# Patient Record
Sex: Male | Born: 1965 | Race: White | Hispanic: No | Marital: Married | Smoking: Never smoker
Health system: Southern US, Community
[De-identification: ages and names within clinical notes are randomized; demographics above are authoritative.]

## PROBLEM LIST (undated history)

## (undated) DIAGNOSIS — R7303 Prediabetes: Secondary | ICD-10-CM

## (undated) DIAGNOSIS — E785 Hyperlipidemia, unspecified: Secondary | ICD-10-CM

---

## 2014-03-21 ENCOUNTER — Ambulatory Visit (INDEPENDENT_AMBULATORY_CARE_PROVIDER_SITE_OTHER): Payer: BC Managed Care – PPO | Admitting: Podiatry

## 2014-03-21 ENCOUNTER — Ambulatory Visit (INDEPENDENT_AMBULATORY_CARE_PROVIDER_SITE_OTHER): Payer: BC Managed Care – PPO

## 2014-03-21 DIAGNOSIS — M7660 Achilles tendinitis, unspecified leg: Secondary | ICD-10-CM

## 2014-03-21 DIAGNOSIS — R52 Pain, unspecified: Secondary | ICD-10-CM

## 2014-03-21 MED ORDER — DICLOFENAC SODIUM 75 MG PO TBEC: 75.0000 mg | DELAYED_RELEASE_TABLET | Freq: Two times a day (BID) | ORAL | Status: AC

## 2014-03-21 MED ORDER — TRIAMCINOLONE ACETONIDE 10 MG/ML IJ SUSP
10.0000 mg | Freq: Once | INTRAMUSCULAR | Status: AC
Start: 2014-03-21 — End: 2014-03-21
  Administered 2014-03-21: 10 mg

## 2014-03-21 NOTE — Patient Instructions (Signed)

## 2014-03-21 NOTE — Progress Notes (Signed)
   Subjective:    Patient ID: Austin Gay, male    DOB: 26-Nov-1965, 48 y.o.   MRN: [id number]  HPI PT STATED LT FOOT BACK OF THE HEEL IS SORE AND HAVE NUMBNESS FEELING ABOUT 1 1/2 MONTH. THE FOOT IS BEEN THE SAME.L THE FOOT GET AGGRAVATED FIRST THING IN THE MORNING. TRIED ICE BUT NO HELP.   Review of Systems  All other systems reviewed and are negative.      Objective:   Physical Exam        Assessment & Plan:

## 2014-03-21 NOTE — Progress Notes (Signed)
Subjective:     Patient ID: Austin Gay, male   DOB: [birth year], 48 y.o.   MRN: [id number]  HPI patient presents stating I'm getting a lot of pain in the back of my left heel and it has been difficult to do certain activities. It's been present for about 6 weeks and I've tried ice and I've tried shoe modifications   Review of Systems  All other systems reviewed and are negative.      Objective:   Physical Exam  Nursing note and vitals reviewed. Constitutional: He is oriented to person, place, and time.  Cardiovascular: Intact distal pulses.   Musculoskeletal: Normal range of motion.  Neurological: He is oriented to person, place, and time.  Skin: Skin is warm.   neurovascular status intact with muscle strength adequate and range of motion of the subtalar and midtarsal joint within normal limits. Patient has minimal equinus condition and is found to have inflammation in the posterior heel lateral side with a mild amount of fluid buildup and pain when pressed. Patient's digits are well-perfused and no depression of the arch is noted     Assessment:     Inflammatory Achilles tendinitis left lateral side with the medial and central tendon insertion not painful    Plan:      H&P and x-ray reviewed. Today after explaining risk of injection and rupture I did do a very careful lateral injection 3 mg dexamethasone Kenalog 5 mg Xylocaine and instructed on physical therapy and he'll lift usage. I did advise him on being careful with this for the next week. Reappoint if symptomatic

## 2015-10-05 ENCOUNTER — Ambulatory Visit (INDEPENDENT_AMBULATORY_CARE_PROVIDER_SITE_OTHER): Payer: 59

## 2015-10-05 ENCOUNTER — Ambulatory Visit (INDEPENDENT_AMBULATORY_CARE_PROVIDER_SITE_OTHER): Payer: 59 | Admitting: Podiatry

## 2015-10-05 DIAGNOSIS — M7662 Achilles tendinitis, left leg: Secondary | ICD-10-CM | POA: Diagnosis not present

## 2015-10-05 DIAGNOSIS — M779 Enthesopathy, unspecified: Secondary | ICD-10-CM

## 2015-10-05 NOTE — Progress Notes (Signed)
Subjective:     Patient ID: Austin Gay, male   DOB: 09/18/1965, 50 y.o.   MRN: [id number]  HPI patient presents stating that I'm doing better with my left foot but occasionally I get these sharp pains that I wanted you did check   Review of Systems     Objective:   Physical Exam Neurovascular status intact with difficulty in replicating the pain that the patient experiences me puts his left leg over his right leg and ties issues. Moderate discomfort in the Achilles tendon left    Assessment:     Appears to be inflammatory versus anything else and at this point reviewed    Plan:     X-rays reviewed and advised on physical therapy supportive shoes and if symptoms were to worsen we'll need to consider immobilization. Do not recommend any other aggressive treatment plan  Trays indicated small posterior spur formation but no indications of problems with the ankle joint or signs of arthritis

## 2019-08-04 ENCOUNTER — Other Ambulatory Visit: Payer: Self-pay | Admitting: Orthopedic Surgery

## 2019-08-04 DIAGNOSIS — M25511 Pain in right shoulder: Secondary | ICD-10-CM

## 2019-08-26 ENCOUNTER — Ambulatory Visit
Admission: RE | Admit: 2019-08-26 | Discharge: 2019-08-26 | Disposition: A | Discharge status: Home / Self Care | Payer: BC Managed Care – PPO | Source: Ambulatory Visit | Attending: Orthopedic Surgery | Admitting: Orthopedic Surgery

## 2019-08-26 ENCOUNTER — Other Ambulatory Visit: Payer: Self-pay

## 2019-08-26 DIAGNOSIS — M25511 Pain in right shoulder: Secondary | ICD-10-CM

## 2019-08-26 MED ORDER — IOPAMIDOL (ISOVUE-M 200) INJECTION 41%: 12.0000 mL | Freq: Once | INTRAMUSCULAR | Status: DC

## 2019-08-26 MED ORDER — METHYLPREDNISOLONE ACETATE 40 MG/ML INJ SUSP (RADIOLOG: 60.0000 mg | Freq: Once | INTRAMUSCULAR | Status: DC

## 2020-12-07 IMAGING — MR MR SHOULDER*R* W/CM
5 series · 40 of 40 positions shown · IV contrast (agent unspecified)
Comparison: Image from contrast injection reviewed.

CLINICAL DATA: Increasing right shoulder pain over the past year.
No known injury. The patient is a tennis player.

EXAM:
MR ARTHROGRAM OF THE RIGHT SHOULDER
TECHNIQUE: Multiplanar, multisequence MR imaging of the right shoulder was
performed following the administration of intra-articular contrast.
CONTRAST:  See Injection Documentation.

[Series 3: T1 fat-sat · axial · 4.0mm · 0.29mm/px · z∈[-52,+46]mm · 10 of 21 slices shown (1 of 3)]
[im 1/21]
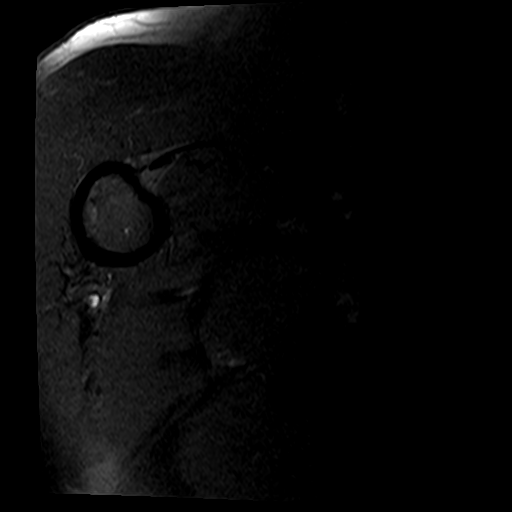
[im 3/21]
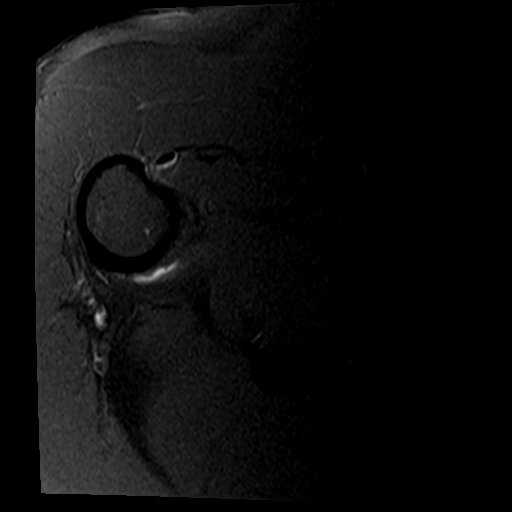
[im 5/21]
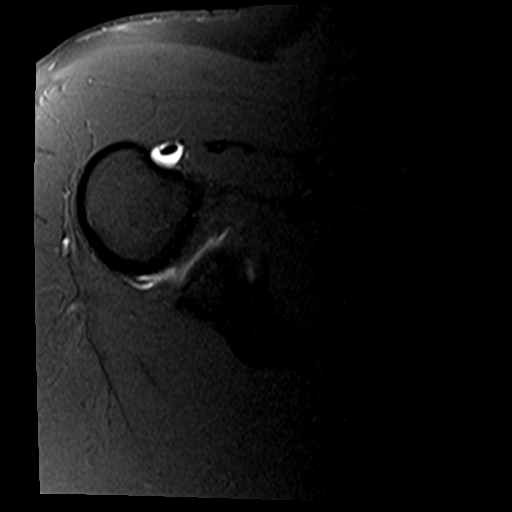
[im 7/21]
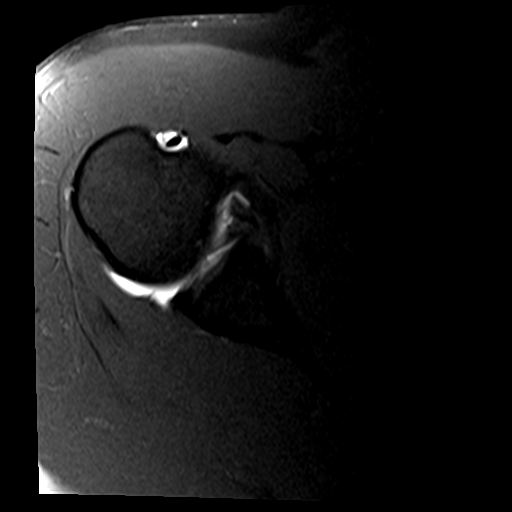
[im 9/21]
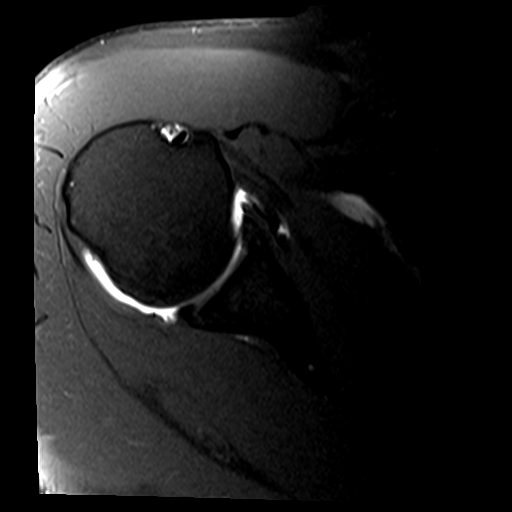
[im 12/21]
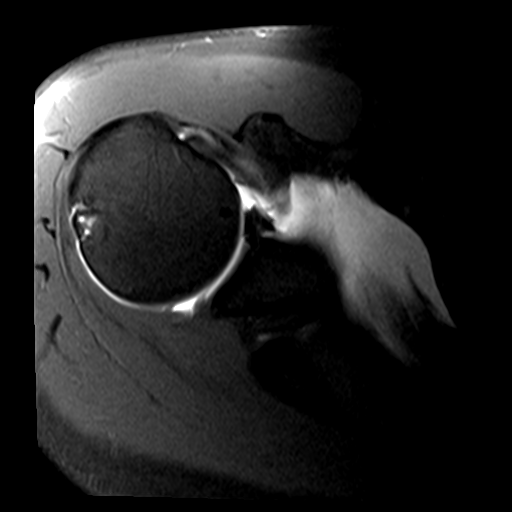
[im 14/21]
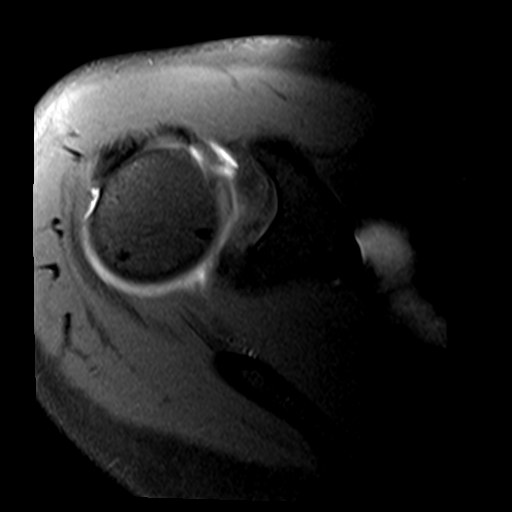
[im 16/21]
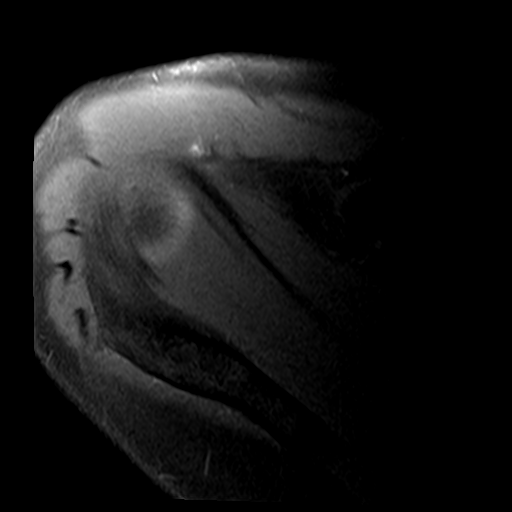
[im 18/21]
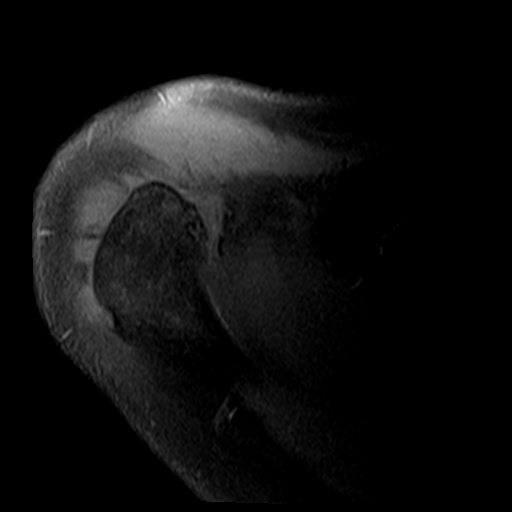
[im 21/21]
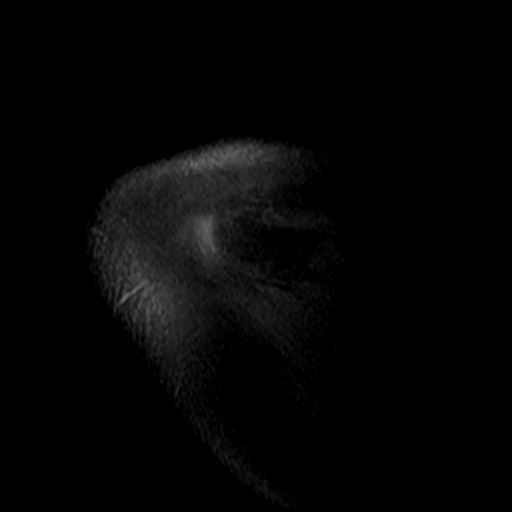

[Series 4: T2 fat-sat · oblique · 4.0mm · 0.59mm/px · 9 of 21 slices shown (1 of 2)]
[im 1/21]
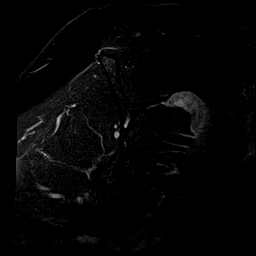
[im 3/21]
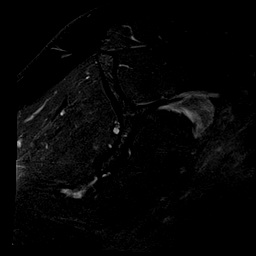
[im 6/21]
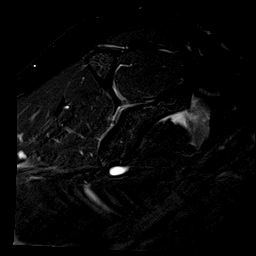
[im 8/21]
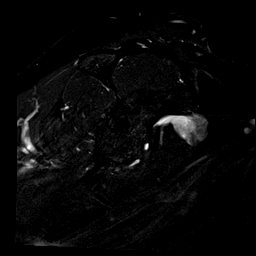
[im 11/21]
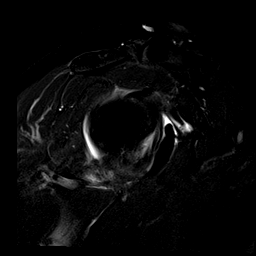
[im 13/21]
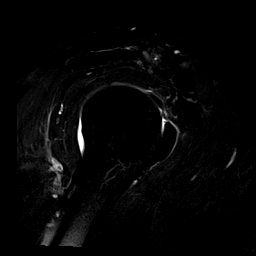
[im 16/21]
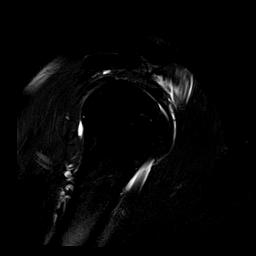
[im 18/21]
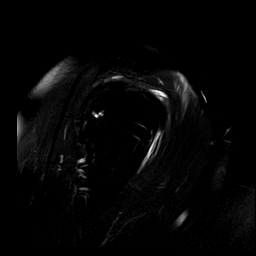
[im 21/21]
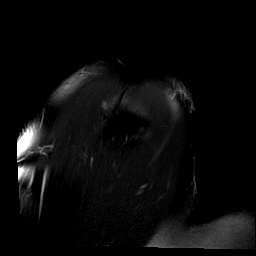

[Series 5: T1 fat-sat · oblique · 4.0mm · 0.59mm/px · 7 of 17 slices shown (2 of 3)]
[im 1/17]
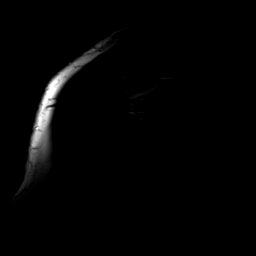
[im 3/17]
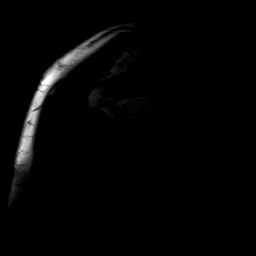
[im 6/17]
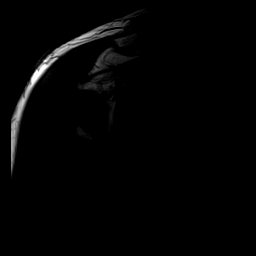
[im 9/17]
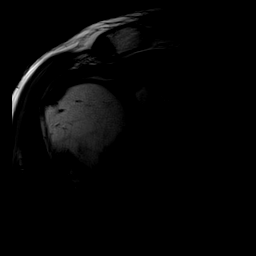
[im 11/17]
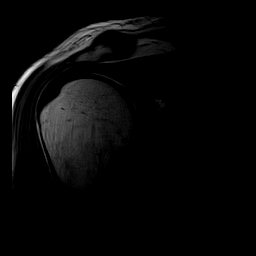
[im 14/17]
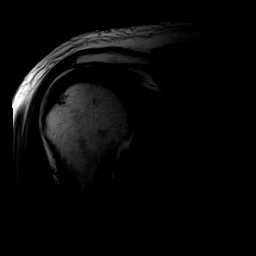
[im 17/17]
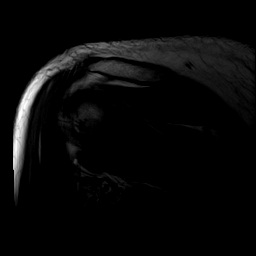

[Series 6: T1 fat-sat · oblique · 4.0mm · 0.55mm/px · 7 of 17 slices shown (3 of 3)]
[im 1/17]
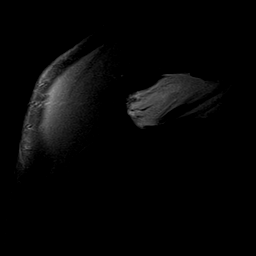
[im 3/17]
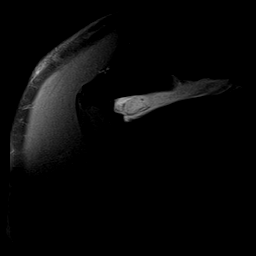
[im 6/17]
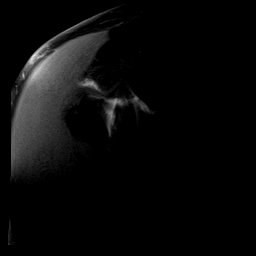
[im 9/17]
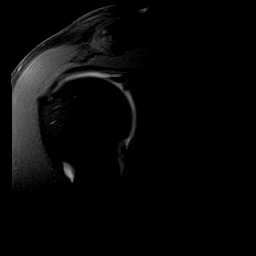
[im 11/17]
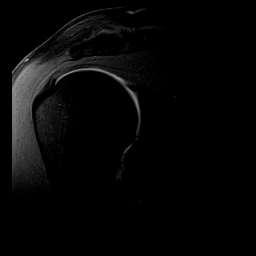
[im 14/17]
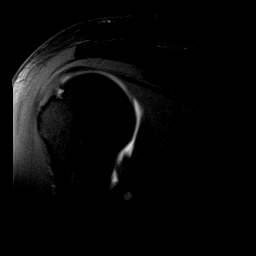
[im 17/17]
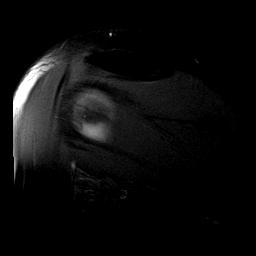

[Series 7: T2 fat-sat · oblique · 4.0mm · 0.55mm/px · 7 of 17 slices shown (2 of 2)]
[im 1/17]
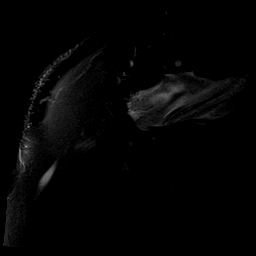
[im 3/17]
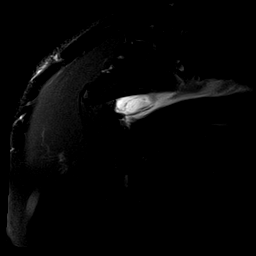
[im 6/17]
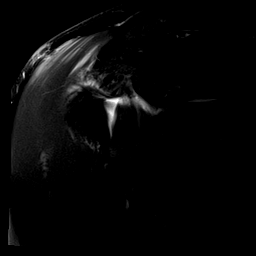
[im 9/17]
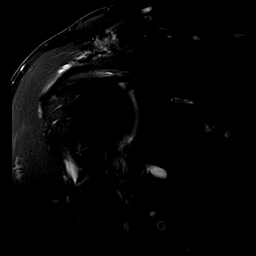
[im 11/17]
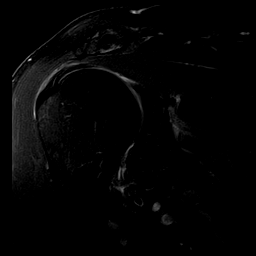
[im 14/17]
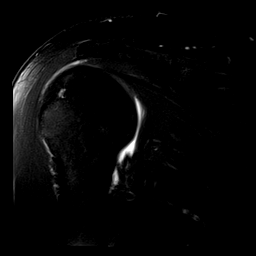
[im 17/17]
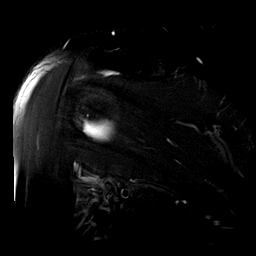

[40 of 40 positions shown; findings below may reference images not displayed]

FINDINGS: Rotator cuff: Intact. Mildly increased T2 signal in the
supraspinatus and infraspinatus tendons consistent with tendinopathy
noted.

Muscles: Normal without atrophy or focal lesion.

Biceps long head: Intact with normal signal. The biceps attachment
to the superior labrum is normal.

Acromioclavicular Joint: Moderate degenerative change is present.
The acromion is type 2. There is no contrast in the
subacromial/subdeltoid bursa but there is some fluid in the bursa.

Glenohumeral Joint: Appears normal.

Labrum: The anterior, inferior labrum appears degenerated and frayed
but no focal labral tear is seen.

Bones: No fracture or focal lesion. Marrow signal is unremarkable.
IMPRESSION: 1. Mild appearing supraspinatus and infraspinatus tendinopathy
without tear.
2. Moderate acromioclavicular osteoarthritis.
3. Degenerated and frayed anterior, inferior labrum without frank
tear identified.
4. Small volume of subacromial/subdeltoid fluid compatible with
bursitis.

## 2022-05-23 ENCOUNTER — Other Ambulatory Visit: Payer: Self-pay

## 2022-05-23 ENCOUNTER — Emergency Department (HOSPITAL_BASED_OUTPATIENT_CLINIC_OR_DEPARTMENT_OTHER): Payer: BC Managed Care – PPO | Admitting: Radiology

## 2022-05-23 ENCOUNTER — Encounter (HOSPITAL_BASED_OUTPATIENT_CLINIC_OR_DEPARTMENT_OTHER): Payer: Self-pay | Admitting: Emergency Medicine

## 2022-05-23 ENCOUNTER — Emergency Department (HOSPITAL_BASED_OUTPATIENT_CLINIC_OR_DEPARTMENT_OTHER): Payer: BC Managed Care – PPO

## 2022-05-23 ENCOUNTER — Emergency Department (HOSPITAL_BASED_OUTPATIENT_CLINIC_OR_DEPARTMENT_OTHER)
Admission: EM | Admit: 2022-05-23 | Discharge: 2022-05-23 | Disposition: A | Discharge status: Home / Self Care | Payer: BC Managed Care – PPO | Attending: Emergency Medicine | Admitting: Emergency Medicine

## 2022-05-23 DIAGNOSIS — R630 Anorexia: Secondary | ICD-10-CM | POA: Diagnosis not present

## 2022-05-23 DIAGNOSIS — R7989 Other specified abnormal findings of blood chemistry: Secondary | ICD-10-CM | POA: Diagnosis not present

## 2022-05-23 DIAGNOSIS — R1031 Right lower quadrant pain: Secondary | ICD-10-CM | POA: Insufficient documentation

## 2022-05-23 DIAGNOSIS — D709 Neutropenia, unspecified: Secondary | ICD-10-CM | POA: Insufficient documentation

## 2022-05-23 DIAGNOSIS — D696 Thrombocytopenia, unspecified: Secondary | ICD-10-CM | POA: Insufficient documentation

## 2022-05-23 HISTORY — DX: Hyperlipidemia, unspecified: E78.5

## 2022-05-23 HISTORY — DX: Prediabetes: R73.03

## 2022-05-23 LAB — URINALYSIS, ROUTINE W REFLEX MICROSCOPIC
Bilirubin Urine: NEGATIVE
Glucose, UA: 500 mg/dL — AB
Hgb urine dipstick: NEGATIVE
Ketones, ur: NEGATIVE mg/dL
Leukocytes,Ua: NEGATIVE
Nitrite: NEGATIVE
Protein, ur: NEGATIVE mg/dL
Specific Gravity, Urine: 1.013 (ref 1.005–1.030)
pH: 6.5 (ref 5.0–8.0)

## 2022-05-23 LAB — CBC WITH DIFFERENTIAL/PLATELET
Abs Immature Granulocytes: 0 10*3/uL (ref 0.00–0.07)
Basophils Absolute: 0.1 10*3/uL (ref 0.0–0.1)
Basophils Relative: 2 %
Eosinophils Absolute: 0 10*3/uL (ref 0.0–0.5)
Eosinophils Relative: 0 %
HCT: 41.3 % (ref 39.0–52.0)
Hemoglobin: 14.4 g/dL (ref 13.0–17.0)
Lymphocytes Relative: 58 %
Lymphs Abs: 2 10*3/uL (ref 0.7–4.0)
MCH: 30.6 pg (ref 26.0–34.0)
MCHC: 34.9 g/dL (ref 30.0–36.0)
MCV: 87.9 fL (ref 80.0–100.0)
Monocytes Absolute: 0.3 10*3/uL (ref 0.1–1.0)
Monocytes Relative: 10 %
Neutro Abs: 1 10*3/uL — ABNORMAL LOW (ref 1.7–7.7)
Neutrophils Relative %: 30 %
Platelets: 105 10*3/uL — ABNORMAL LOW (ref 150–400)
RBC: 4.7 MIL/uL (ref 4.22–5.81)
RDW: 12.3 % (ref 11.5–15.5)
Smear Review: NORMAL
WBC: 3.4 10*3/uL — ABNORMAL LOW (ref 4.0–10.5)
nRBC: 0 % (ref 0.0–0.2)

## 2022-05-23 LAB — COMPREHENSIVE METABOLIC PANEL
ALT: 149 U/L — ABNORMAL HIGH (ref 0–44)
AST: 93 U/L — ABNORMAL HIGH (ref 15–41)
Albumin: 4.3 g/dL (ref 3.5–5.0)
Alkaline Phosphatase: 84 U/L (ref 38–126)
Anion gap: 10 (ref 5–15)
BUN: 13 mg/dL (ref 6–20)
CO2: 25 mmol/L (ref 22–32)
Calcium: 8.9 mg/dL (ref 8.9–10.3)
Chloride: 98 mmol/L (ref 98–111)
Creatinine, Ser: 0.98 mg/dL (ref 0.61–1.24)
GFR, Estimated: >60 mL/min (ref >60)
Glucose, Bld: 250 mg/dL — ABNORMAL HIGH (ref 70–99)
Potassium: 4 mmol/L (ref 3.5–5.1)
Sodium: 133 mmol/L — ABNORMAL LOW (ref 135–145)
Total Bilirubin: 0.7 mg/dL (ref 0.3–1.2)
Total Protein: 6.9 g/dL (ref 6.5–8.1)

## 2022-05-23 LAB — PROTIME-INR
INR: 1 (ref 0.8–1.2)
Prothrombin Time: 13.5 seconds (ref 11.4–15.2)

## 2022-05-23 MED ORDER — DOXYCYCLINE HYCLATE 100 MG PO TABS
100.0000 mg | ORAL_TABLET | Freq: Once | ORAL | Status: AC
  Administered 2022-05-23: 100 mg via ORAL
  Filled 2022-05-23: qty 1

## 2022-05-23 MED ORDER — DOXYCYCLINE HYCLATE 100 MG PO CAPS: 100.0000 mg | ORAL_CAPSULE | Freq: Two times a day (BID) | ORAL | 0 refills | Status: AC

## 2022-05-23 NOTE — ED Triage Notes (Signed)
Pt arrives to ED with c/o abnormal labs. He reports going to his PCP yesterday for body aches. The body aches started on 10/21. Associated symptoms include chills, night sweats. His abnormal labs included low WBC, platelets and elevated liver enzymes that were drawn yesterday. Was tested negative for covid/flu yesterday as well.

## 2022-05-23 NOTE — Discharge Instructions (Addendum)
You were seen in the emergency department for your abnormal labs.  Your white blood cell count and your liver numbers are improving compared to when your labs were performed as an outpatient a few days ago and your platelet numbers are normal.  The rest of your liver numbers were normal and your liver ultrasound appeared normal.  You had no obvious signs of urinary tract infection or pneumonia or gallbladder infection.  It is possible that you could have a tickborne illness such as Ehrlichiosis which can cause some of the symptoms that you are having as well as the abnormal labs that you are having.  This is treated with an antibiotic and you should complete this as prescribed.  Should follow-up with your primary doctor in the next week to have your symptoms and labs rechecked.  You should return to the emergency department if you are having severe abdominal pain, fevers, repetitive vomiting and cannot keep anything down, increasing bleeding or if you have any other new or concerning symptoms.

## 2022-05-23 NOTE — ED Provider Notes (Signed)
[facility] EMERGENCY DEPT Provider Note   CSN: [id number] Arrival date & time: 05/23/22  1422     History  Chief Complaint  Patient presents with   Abnormal Lab    Austin Gay is a 56 y.o. male.  Patient is a 56 year old male with a past medical history of hyperlipidemia and prediabetes presenting to the emergency department with abnormal labs.  Patient states that on Saturday he started to develop body aches and they worsened on Sunday and he started to have night sweats and chills.  He states that he saw his primary doctor who performed labs and he received a call today that his labs were abnormal and recommended that he come to the emergency department.  He states he has not measured any fever.  He denies any cough, congestion or sore throat.  He denies any rash.  He states that he has had a mildly decreased appetite but denies any nausea, vomiting or diarrhea.  He denies any abdominal pain.  He states that he drinks 1-2 beers daily but has never had any alcohol withdrawal.  He denies any known sick contacts.  He does report hiking frequently in the woods.  He denies seeing any recent ticks on him.  The history is provided by the patient.  Abnormal Lab      Home Medications Prior to Admission medications   Medication Sig Start Date End Date Taking? Authorizing Provider  doxycycline (VIBRAMYCIN) 100 MG capsule Take 1 capsule (100 mg total) by mouth 2 (two) times daily for 5 days. 05/23/22 05/28/22 Yes Theresia Lo, Benetta Spar K, DO  diclofenac (VOLTAREN) 75 MG EC tablet Take 1 tablet (75 mg total) by mouth 2 (two) times daily. Patient not taking: Reported on 10/05/2015 03/21/14   Lenn Sink, DPM      Allergies    Patient has no known allergies.    Review of Systems   Review of Systems  Physical Exam Updated Vital Signs BP 136/84   Pulse 65   Temp 98.7 F (37.1 C) (Oral)   Resp 18   Ht 6\' 3"  (1.905 m)   Wt 117.9 kg   SpO2 98%   BMI 32.50 kg/m   Physical Exam Vitals and nursing note reviewed.  Constitutional:      General: He is not in acute distress.    Appearance: Normal appearance.  HENT:     Head: Normocephalic and atraumatic.     Nose: Nose normal.     Mouth/Throat:     Mouth: Mucous membranes are moist.     Pharynx: Oropharynx is clear.  Eyes:     Extraocular Movements: Extraocular movements intact.     Conjunctiva/sclera: Conjunctivae normal.     Pupils: Pupils are equal, round, and reactive to light.  Cardiovascular:     Rate and Rhythm: Normal rate and regular rhythm.     Pulses: Normal pulses.     Heart sounds: Normal heart sounds.  Pulmonary:     Effort: Pulmonary effort is normal.     Breath sounds: Normal breath sounds.  Abdominal:     General: Abdomen is flat.     Palpations: Abdomen is soft.     Tenderness: There is no abdominal tenderness.  Musculoskeletal:        General: Normal range of motion.     Cervical back: Normal range of motion and neck supple.  Skin:    General: Skin is warm and dry.     Findings: No rash.  Comments: No visible tics  Neurological:     General: No focal deficit present.     Mental Status: He is alert and oriented to person, place, and time.  Psychiatric:        Mood and Affect: Mood normal.        Behavior: Behavior normal.     ED Results / Procedures / Treatments   Labs (all labs ordered are listed, but only abnormal results are displayed) Labs Reviewed  CBC WITH DIFFERENTIAL/PLATELET - Abnormal; Notable for the following components:      Result Value   WBC 3.4 (*)    Platelets 105 (*)    Neutro Abs 1.0 (*)    All other components within normal limits  COMPREHENSIVE METABOLIC PANEL - Abnormal; Notable for the following components:   Sodium 133 (*)    Glucose, Bld 250 (*)    AST 93 (*)    ALT 149 (*)    All other components within normal limits  URINALYSIS, ROUTINE W REFLEX MICROSCOPIC - Abnormal; Notable for the following components:   Glucose, UA 500  (*)    All other components within normal limits  PROTIME-INR    EKG None  Radiology US Abdomen Limited RUQ (LIVER/GB)  Result Date: 05/23/2022 CLINICAL DATA:  Elevated LFTs EXAM: ULTRASOUND ABDOMEN LIMITED RIGHT UPPER QUADRANT COMPARISON:  None Available. FINDINGS: Gallbladder: No gallstones or wall thickening visualized. No sonographic Murphy sign noted by sonographer. Common bile duct: Diameter: 4 mm Liver: No focal lesion identified. Within normal limits in parenchymal echogenicity. Portal vein is patent on color Doppler imaging with normal direction of blood flow towards the liver. Other: None. IMPRESSION: Unremarkable right upper quadrant ultrasound examination. Electronically Signed   By: Agustin Cree M.D.   On: 05/23/2022 18:31   DG Chest 2 View  Result Date: 05/23/2022 CLINICAL DATA:  Body aches EXAM: CHEST - 2 VIEW COMPARISON:  None Available. FINDINGS: The heart size and mediastinal contours are within normal limits. Both lungs are clear. The visualized skeletal structures are unremarkable. IMPRESSION: No active cardiopulmonary disease. Electronically Signed   By: Marlan Palau M.D.   On: 05/23/2022 15:01    Procedures Procedures    Medications Ordered in ED Medications  doxycycline (VIBRA-TABS) tablet 100 mg (has no administration in time range)    ED Course/ Medical Decision Making/ A&P Clinical Course as of 05/23/22 1921  Thu May 23, 2022  1851 RUQ [place] without acute disease and INR normal making liver dysfunction less likely cause of his abnormal labs, UA negative. Patient will be treated for suspicion of tick borne illness and was recommended primary care follow up for lab and symptom recheck [VK]    Clinical Course User Index [VK] Rexford Maus, DO                           Medical Decision Making This patient presents to the ED with chief complaint(s) of abnormal labs with pertinent past medical history of hyperlipidemia, prediabetes which further  complicates the presenting complaint. The complaint involves an extensive differential diagnosis and also carries with it a high risk of complications and morbidity.    The differential diagnosis includes tickborne illness, hepatitis, pancreatitis, cholelithiasis, cholecystitis, cirrhosis, infection, sepsis  Additional history obtained: Additional history obtained from N/A Records reviewed Care Everywhere/External Records and Primary Care Documents  ED Course and Reassessment: Patient was initially evaluated in triage and had repeat labs performed here.  His labs  showed a continued neutropenia and leukopenia though improved from his outpatient labs as well as continued transaminitis which have also improved from his outpatient labs.  Platelets are stable at 105.  He has normal bilirubins.  He will additionally have repeat urine analysis and PT/INR to further evaluate for liver dysfunction.  Right upper quadrant ultrasound will be performed to evaluate for cause of his transaminitis.  He did have [place] and [facility] spotted fever serologies sent by his PCP that are pending.  Independent labs interpretation:  The following labs were independently interpreted: Improving leukopenia and transaminitis, stable thrombocytopenia  Independent visualization of imaging: - I independently visualized the following imaging with scope of interpretation limited to determining acute life threatening conditions related to emergency care: Right upper quadrant ultrasound, chest x-ray, which revealed no acute disease  Consultation: - Consulted or discussed management/test interpretation w/ external professional: N/A  Consideration for admission or further workup: Patient has no emergent conditions requiring admission or further work-up at this time and is stable for discharge with primary care follow-up Social Determinants of health: N/A    Amount and/or Complexity of Data Reviewed Labs: ordered. Radiology:  ordered.  Risk Prescription drug management.          Final Clinical Impression(s) / ED Diagnoses Final diagnoses:  Thrombocytopenia (HCC)  Elevated LFTs  Neutropenia, unspecified type ([place])    Rx / DC Orders ED Discharge Orders          Ordered    doxycycline (VIBRAMYCIN) 100 MG capsule  2 times daily        05/23/22 [place], [place] K, DO 05/23/22 1922

## 2023-05-02 ENCOUNTER — Emergency Department (HOSPITAL_BASED_OUTPATIENT_CLINIC_OR_DEPARTMENT_OTHER)
Admission: EM | Admit: 2023-05-02 | Discharge: 2023-05-02 | Disposition: A | Discharge status: Home / Self Care | Payer: BC Managed Care – PPO | Attending: Emergency Medicine | Admitting: Emergency Medicine

## 2023-05-02 ENCOUNTER — Encounter (HOSPITAL_BASED_OUTPATIENT_CLINIC_OR_DEPARTMENT_OTHER): Payer: Self-pay

## 2023-05-02 ENCOUNTER — Other Ambulatory Visit: Payer: Self-pay

## 2023-05-02 ENCOUNTER — Emergency Department (HOSPITAL_BASED_OUTPATIENT_CLINIC_OR_DEPARTMENT_OTHER): Payer: BC Managed Care – PPO | Admitting: Radiology

## 2023-05-02 DIAGNOSIS — R002 Palpitations: Secondary | ICD-10-CM | POA: Diagnosis present

## 2023-05-02 LAB — CBC WITH DIFFERENTIAL/PLATELET
Abs Immature Granulocytes: 0.02 10*3/uL (ref 0.00–0.07)
Basophils Absolute: 0.1 10*3/uL (ref 0.0–0.1)
Basophils Relative: 1 %
Eosinophils Absolute: 0.2 10*3/uL (ref 0.0–0.5)
Eosinophils Relative: 3 %
HCT: 40.9 % (ref 39.0–52.0)
Hemoglobin: 14.2 g/dL (ref 13.0–17.0)
Immature Granulocytes: 0 %
Lymphocytes Relative: 57 %
Lymphs Abs: 3.6 10*3/uL (ref 0.7–4.0)
MCH: 31.3 pg (ref 26.0–34.0)
MCHC: 34.7 g/dL (ref 30.0–36.0)
MCV: 90.3 fL (ref 80.0–100.0)
Monocytes Absolute: 0.6 10*3/uL (ref 0.1–1.0)
Monocytes Relative: 10 %
Neutro Abs: 1.8 10*3/uL (ref 1.7–7.7)
Neutrophils Relative %: 29 %
Platelets: 218 10*3/uL (ref 150–400)
RBC: 4.53 MIL/uL (ref 4.22–5.81)
RDW: 12.6 % (ref 11.5–15.5)
WBC: 6.2 10*3/uL (ref 4.0–10.5)
nRBC: 0 % (ref 0.0–0.2)

## 2023-05-02 LAB — COMPREHENSIVE METABOLIC PANEL
ALT: 34 U/L (ref 0–44)
AST: 33 U/L (ref 15–41)
Albumin: 4.4 g/dL (ref 3.5–5.0)
Alkaline Phosphatase: 62 U/L (ref 38–126)
Anion gap: 8 (ref 5–15)
BUN: 17 mg/dL (ref 6–20)
CO2: 26 mmol/L (ref 22–32)
Calcium: 9 mg/dL (ref 8.9–10.3)
Chloride: 103 mmol/L (ref 98–111)
Creatinine, Ser: 1 mg/dL (ref 0.61–1.24)
GFR, Estimated: >60 mL/min (ref >60)
Glucose, Bld: 101 mg/dL — ABNORMAL HIGH (ref 70–99)
Potassium: 4.3 mmol/L (ref 3.5–5.1)
Sodium: 137 mmol/L (ref 135–145)
Total Bilirubin: 0.4 mg/dL (ref 0.3–1.2)
Total Protein: 7 g/dL (ref 6.5–8.1)

## 2023-05-02 LAB — TROPONIN I (HIGH SENSITIVITY)
Troponin I (High Sensitivity): 12 ng/L (ref <18)
Troponin I (High Sensitivity): 11 ng/L (ref <18)

## 2023-05-02 LAB — TSH: TSH: 4.736 u[IU]/mL — ABNORMAL HIGH (ref 0.350–4.500)

## 2023-05-02 NOTE — ED Triage Notes (Signed)
Pt states his pulse has been going up to 110, "almost kind of an anxious feeling." States this has been occurring intermittently for about 1.5 months, normal evaluation by PMD, seen by cardiology last week, supposed to have echocardiogram and monitor next week.  Patient denies chest pain, SOB, dizziness, nausea or vomiting.

## 2023-05-02 NOTE — ED Provider Notes (Signed)
[place] EMERGENCY DEPARTMENT AT [facility]  Provider Note  CSN: [id number] Arrival date & time: 05/02/23 0053  History Chief Complaint  Patient presents with   Tachycardia    Austin Gay is a 57 y.o. male with history of HLD and pre-diabetes presents for evaluation of palpitations. He reports since August he has had multiple episodes of waking up around midnight to use the restroom and afterwards having a brief period of racing heart, shaking chills (without fever), rapid breathing, tingling in fingers and feeling anxious. He reports he typically drinks some juice or water, rests in bed and symptoms resolve after a few minutes. He checks his HR and it is typically 110-120s. Not having chest pain. He is a daily runner, resting HR is in the 50s. He saw his PCP about 2 weeks ago and had labs done which were unremarkable. Went to Cardiology earlier this week and is awaiting echo and holter monitor to complete that part of his workup. He has not had thyroid function done. He has otherwise been in his usual state of health. Feels fine during the day. Wears his CPAP at night. He notes last year he had labs showing neutropenia, thrombocytopenia and transaminitis. He was given a course of doxycycline for presumed tick infection although titers were negative and his labs eventually normalized.    Home Medications Prior to Admission medications   Medication Sig Start Date End Date Taking? Authorizing Provider  diclofenac (VOLTAREN) 75 MG EC tablet Take 1 tablet (75 mg total) by mouth 2 (two) times daily. Patient not taking: Reported on 10/05/2015 03/21/14   Lenn Sink, DPM     Allergies    Patient has no known allergies.   Review of Systems   Review of Systems Please see HPI for pertinent positives and negatives  Physical Exam BP 134/75   Pulse (!) 51   Temp 98.1 F (36.7 C) (Oral)   Resp 18   Ht 6\' 3"  (1.905 m)   Wt 113.4 kg   SpO2 97%   BMI 31.25 kg/m    Physical Exam Vitals and nursing note reviewed.  Constitutional:      Appearance: Normal appearance.  HENT:     Head: Normocephalic and atraumatic.     Nose: Nose normal.     Mouth/Throat:     Mouth: Mucous membranes are moist.  Eyes:     Extraocular Movements: Extraocular movements intact.     Conjunctiva/sclera: Conjunctivae normal.  Cardiovascular:     Rate and Rhythm: Normal rate.  Pulmonary:     Effort: Pulmonary effort is normal.     Breath sounds: Normal breath sounds.  Chest:     Chest wall: No tenderness.  Abdominal:     General: Abdomen is flat.     Palpations: Abdomen is soft.     Tenderness: There is no abdominal tenderness.  Musculoskeletal:        General: No swelling. Normal range of motion.     Cervical back: Neck supple.  Skin:    General: Skin is warm and dry.  Neurological:     General: No focal deficit present.     Mental Status: He is alert.  Psychiatric:        Mood and Affect: Mood normal.     ED Results / Procedures / Treatments   EKG EKG Interpretation Date/Time:  Friday May 02 2023 01:04:44 EDT Ventricular Rate:  57 PR Interval:  172 QRS Duration:  91 QT Interval:  431  QTC Calculation: 420 R Axis:   60  Text Interpretation: Sinus rhythm Normal ECG Since last tracing Lead placement corrected Confirmed by Susy Frizzle [phone]) on 05/02/2023 1:07:54 AM  Procedures Procedures  Medications Ordered in the ED Medications - No data to display  Initial Impression and Plan  Patient here with occasional episodes of rapid HR, typically when he gets up to urinate during the night. Overall his exam is benign. Vitals are normal, resting HR now in the 50s as per his reported baseline. See Apple Watch HR report above showing the brief period of tachycardia. Low concern for ACS, PE or PTX. Will check his labs today, including TSH and CXR. If no concerning findings, will defer continued workup to his outpatient doctors.   ED Course    Clinical Course as of 05/02/23 0355  Fri May 02, 2023  0143 I personally viewed the images from radiology studies and agree with radiologist interpretation: CXR is clear [CS]  0205 CBC and CMP are normal.  [CS]  0210 Initial Trop is normal. Will continue to monitor and recheck delta.  [CS]  0241 TSH is borderline elevated, not likely to be contributing to his symptoms which are more consistent with hyperthyroid. Recommend he follow up with PCP for recheck.  [CS]  0354 Repeat trop remains normal. HR has remained consistently in 50s without any concerning dysrhythmias. Patient reassured no emergent or life-threatening cause of his symptoms found. Recommend he continue with his current outpatient workup. RTED for any worsening or other concerns.  [CS]    Clinical Course User Index [CS] Pollyann Savoy, MD     MDM Rules/Calculators/A&P Medical Decision Making Given presenting complaint, I considered that admission might be necessary. After review of results from ED lab and/or imaging studies, admission to the hospital is not indicated at this time.    Problems Addressed: Palpitations: acute illness or injury  Amount and/or Complexity of Data Reviewed Labs: ordered. Decision-making details documented in ED Course. Radiology: ordered and independent interpretation performed. Decision-making details documented in ED Course. ECG/medicine tests: ordered and independent interpretation performed. Decision-making details documented in ED Course.  Risk Decision regarding hospitalization.     Final Clinical Impression(s) / ED Diagnoses Final diagnoses:  Palpitations    Rx / DC Orders ED Discharge Orders     None        Pollyann Savoy, MD 05/02/23 [phone]

## 2023-05-02 NOTE — ED Notes (Signed)
Reviewed AVS with patient, patient expressed understanding of directions, denies further questions at this time.
